# Patient Record
Sex: Male | Born: 2005 | Race: White | Hispanic: No | Marital: Single | Smoking: Never smoker
Health system: Southern US, Community
[De-identification: ages and names within clinical notes are randomized; demographics above are authoritative.]

## PROBLEM LIST (undated history)

## (undated) DIAGNOSIS — F845 Asperger's syndrome: Secondary | ICD-10-CM

## (undated) DIAGNOSIS — F909 Attention-deficit hyperactivity disorder, unspecified type: Secondary | ICD-10-CM

---
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
## 2023-06-19 ENCOUNTER — Ambulatory Visit: Payer: Self-pay | Admitting: Physician Assistant

## 2023-06-19 ENCOUNTER — Encounter: Payer: Self-pay | Admitting: Physician Assistant

## 2023-06-19 VITALS — BP 128/80 | HR 98 | Temp 98.3°F | Resp 16 | Ht 66.0 in | Wt 161.2 lb

## 2023-06-19 DIAGNOSIS — F845 Asperger's syndrome: Secondary | ICD-10-CM

## 2023-06-19 DIAGNOSIS — Z23 Encounter for immunization: Secondary | ICD-10-CM | POA: Diagnosis not present

## 2023-06-19 DIAGNOSIS — F909 Attention-deficit hyperactivity disorder, unspecified type: Secondary | ICD-10-CM | POA: Diagnosis not present

## 2023-06-19 MED ORDER — METHYLPHENIDATE HCL 10 MG PO TABS: 10.0000 mg | ORAL_TABLET | Freq: Every day | ORAL | 0 refills | Status: DC | PRN

## 2023-06-19 MED ORDER — METHYLPHENIDATE HCL ER (CD) 50 MG PO CPCR: 50.0000 mg | ORAL_CAPSULE | Freq: Every day | ORAL | 0 refills | Status: DC

## 2023-06-19 MED ORDER — GUANFACINE HCL ER 2 MG PO TB24: 2.0000 mg | ORAL_TABLET | Freq: Every day | ORAL | 1 refills | Status: DC

## 2023-06-19 NOTE — Progress Notes (Signed)
New patient visit   Patient: Zachary Nicholson   DOB: 2006/06/20   17 y.o. Male  MRN: [id number] Visit Date: 06/19/2023  Today's healthcare provider: Alfredia Ferguson, PA-C   Cc. New patient, adhd  Subjective    Zachary Nicholson is a 17 y.o. male who presents today as a new patient to establish care.  HPI  Discussed the use of AI scribe software for clinical note transcription with the patient, who gave verbal consent to proceed.  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 The patient has been on a stable regimen of ADHD medications, including Ritalin and Intuniv, for several years. The dosage was reduced when the patient was 17 years old, with no reported issues. The patient denies any current health issues and reports being in good health.       Past Medical History:  Diagnosis Date   ADHD (attention deficit hyperactivity disorder)    Asperger syndrome    Past Surgical History:  Procedure Laterality Date   TYMPANOSTOMY TUBE PLACEMENT  2006   No family status information on file.   History reviewed. No pertinent family history. Social History   Socioeconomic History   Marital status: Single    Spouse name: Not on file   Number of children: Not on file   Years of education: Not on file   Highest education level: Not on file  Occupational History   Not on file  Tobacco Use   Smoking status: Never   Smokeless tobacco: Never  Substance and Sexual Activity   Alcohol use: Never   Drug use: Not on file   Sexual activity: Not on file  Other Topics Concern   Not on file  Social History Narrative   Not on file   Social Determinants of Health   Financial Resource Strain: Not on file  Food Insecurity: Not on file  Transportation Needs: Not on file  Physical Activity: Not on file  Stress: Not on file  Social Connections: Not on file   Outpatient Medications Prior to Visit   Medication Sig   [DISCONTINUED] guanFACINE (INTUNIV) 2 MG TB24 SR tablet Take 2 mg by mouth at bedtime.   [DISCONTINUED] methylphenidate (METADATE CD) 50 MG CR capsule Take 50 mg by mouth daily.   [DISCONTINUED] methylphenidate (RITALIN) 10 MG tablet Take 10 mg by mouth daily as needed.   [DISCONTINUED] Melatonin 3 MG CAPS Take 3 mg by mouth at bedtime.   [DISCONTINUED] methylphenidate (METADATE CD) 20 MG CR capsule Take 20 mg by mouth every evening.     [DISCONTINUED] methylphenidate (METADATE CD) 60 MG CR capsule Take 60 mg by mouth every morning.     [DISCONTINUED] ondansetron (ZOFRAN) 4 MG tablet Take 0.5 tablets (2 mg total) by mouth every 8 (eight) hours as needed for nausea or vomiting.   No facility-administered medications prior to visit.   No Known Allergies  Immunization History  Administered Date(s) Administered   DTaP 05/30/2004, 07/31/2004, 09/25/2004, 06/29/2005, 05/10/2008   H1N1 09/14/2008   HIB (PRP-OMP) 05/30/2004, 07/31/2004, 06/29/2005   Hepatitis A, Ped/Adol-2 Dose 04/09/2005, 10/11/2005   Hepatitis B, PED/ADOLESCENT 2004/02/28, 04/26/2004, 01/03/2005   Hpv-Unspecified 12/16/2017, 07/09/2018   IPV 05/30/2004, 07/31/2004, 01/03/2005, 05/10/2008   Influenza, Seasonal, Injecte, Preservative Fre 06/19/2023   MMR 04/09/2005, 05/10/2008   MenQuadfi_Meningococcal Groups ACYW Conjugate 02/23/2021   Meningococcal B, OMV 02/23/2021, 03/27/2021   Meningococcal polysaccharide vaccine (MPSV4) 08/10/2015   PFIZER(Purple  Top)SARS-COV-2 Vaccination 05/05/2020, 05/26/2020   Pneumococcal Conjugate-13 05/30/2004, 07/31/2004, 09/25/2004, 04/09/2005   Tdap 08/10/2015   Varicella 04/09/2005, 05/10/2008    Health Maintenance  Topic Date Due   HIV Screening  Never done   Hepatitis C Screening  Never done   COVID-19 Vaccine (3 - 2023-24 season) 05/19/2023   DTaP/Tdap/Td (7 - Td or Tdap) 08/09/2025   INFLUENZA VACCINE  Completed   HPV VACCINES  Completed    Patient Care  Team: Alfredia Ferguson, PA-C as PCP - General (Physician Assistant)  Review of Systems  Constitutional:  Negative for fatigue and fever.  Respiratory:  Negative for cough and shortness of breath.   Cardiovascular:  Negative for chest pain, palpitations and leg swelling.  Neurological:  Negative for dizziness and headaches.     Objective    BP 128/80   Pulse 98   Temp 98.3 F (36.8 C) (Oral)   Resp 16   Ht 5\' 6"  (1.676 m)   Wt 161 lb 4 oz (73.1 kg)   SpO2 99%   BMI 26.03 kg/m   Physical Exam Constitutional:      General: He is awake.     Appearance: He is well-developed.  HENT:     Head: Normocephalic.  Eyes:     Conjunctiva/sclera: Conjunctivae normal.  Cardiovascular:     Rate and Rhythm: Normal rate and regular rhythm.     Heart sounds: Normal heart sounds.  Pulmonary:     Effort: Pulmonary effort is normal.     Breath sounds: Normal breath sounds.  Skin:    General: Skin is warm.  Neurological:     Mental Status: He is alert and oriented to person, place, and time.  Psychiatric:        Attention and Perception: Attention normal.        Mood and Affect: Mood normal.        Speech: Speech normal.        Behavior: Behavior is cooperative.     Depression Screen    06/19/2023    8:59 AM  PHQ 2/9 Scores  PHQ - 2 Score 0   No results found for any visits on 06/19/23.  Assessment & Plan      Problem List Items Addressed This Visit       Other   Attention deficit hyperactivity disorder (ADHD) - Primary    Stable on current medication regimen (Ritalin and Intuniv). No reported side effects or concerns. -Refill Ritalin 40 mg daily and 10 mg prn and Intuniv 2 mg prescriptions. -Recommend setting up with a psychiatrist for future medication management and changes. -Perform annual drug test and controlled substance contract.      Relevant Medications   guanFACINE (INTUNIV) 2 MG TB24 ER tablet   methylphenidate (METADATE CD) 50 MG CR capsule    methylphenidate (RITALIN) 10 MG tablet   Other Relevant Orders   Drug Monitoring Panel [phone] , Urine   Ambulatory referral to Psychiatry   Asperger disorder   Relevant Orders   Ambulatory referral to Psychiatry   Other Visit Diagnoses     Need for immunization against influenza       Relevant Orders   Flu vaccine trivalent PF, 6mos and older(Flulaval,Afluria,Fluarix,Fluzone) (Completed)       General Health Maintenance -Administer influenza vaccine today. -Request transfer of medical records from pediatrician to establish continuity of care. -Plan for physical examination based on last recorded date from pediatrician's records.       Return in  about 1 year (around 06/18/2024) for CPE.     I, Alfredia Ferguson, PA-C have reviewed all documentation for this visit. The documentation on  06/19/23   for the exam, diagnosis, procedures, and orders are all accurate and complete.    Alfredia Ferguson, PA-C  [facility] at [facility] [phone] (phone) [phone] (fax)  [facility]

## 2023-06-19 NOTE — Assessment & Plan Note (Addendum)
Stable on current medication regimen (Ritalin and Intuniv). No reported side effects or concerns. -Refill Ritalin 40 mg daily and 10 mg prn and Intuniv 2 mg prescriptions. -Recommend setting up with a psychiatrist for future medication management and changes. -Perform annual drug test and controlled substance contract.

## 2023-06-21 LAB — DRUG MONITORING PANEL 376104, URINE
Amphetamines: NEGATIVE ng/mL (ref <500)
Barbiturates: NEGATIVE ng/mL (ref <300)
Benzodiazepines: NEGATIVE ng/mL (ref <100)
Cocaine Metabolite: NEGATIVE ng/mL (ref <150)
Desmethyltramadol: NEGATIVE ng/mL (ref <100)
Opiates: NEGATIVE ng/mL (ref <100)
Oxycodone: NEGATIVE ng/mL (ref <100)
Tramadol: NEGATIVE ng/mL (ref <100)

## 2023-06-21 LAB — DM TEMPLATE

## 2023-07-01 ENCOUNTER — Ambulatory Visit: Payer: Self-pay | Admitting: Family Medicine

## 2023-07-22 ENCOUNTER — Other Ambulatory Visit: Payer: Self-pay | Admitting: Physician Assistant

## 2023-07-22 DIAGNOSIS — F909 Attention-deficit hyperactivity disorder, unspecified type: Secondary | ICD-10-CM

## 2023-07-22 MED ORDER — METHYLPHENIDATE HCL ER (CD) 50 MG PO CPCR: 50.0000 mg | ORAL_CAPSULE | Freq: Every day | ORAL | 0 refills | Status: DC

## 2023-08-07 ENCOUNTER — Ambulatory Visit: Payer: Self-pay | Admitting: Family Medicine

## 2023-08-27 ENCOUNTER — Other Ambulatory Visit: Payer: Self-pay | Admitting: Physician Assistant

## 2023-08-27 DIAGNOSIS — F909 Attention-deficit hyperactivity disorder, unspecified type: Secondary | ICD-10-CM

## 2023-08-27 MED ORDER — METHYLPHENIDATE HCL ER (CD) 50 MG PO CPCR: 50.0000 mg | ORAL_CAPSULE | Freq: Every day | ORAL | 0 refills | Status: DC

## 2023-08-27 NOTE — Telephone Encounter (Signed)
Requesting: methylphenidate 50mg   Contract: 06/19/23 UDS: 06/19/23 Last Visit: 06/19/23 Next Visit: None Last Refill: 07/22/23 #30 and 0RF   Please Advise

## 2023-09-28 ENCOUNTER — Other Ambulatory Visit: Payer: Self-pay | Admitting: Physician Assistant

## 2023-09-28 DIAGNOSIS — F909 Attention-deficit hyperactivity disorder, unspecified type: Secondary | ICD-10-CM

## 2023-09-30 MED ORDER — METHYLPHENIDATE HCL ER (CD) 50 MG PO CPCR: 50.0000 mg | ORAL_CAPSULE | Freq: Every day | ORAL | 0 refills | Status: DC

## 2023-09-30 NOTE — Telephone Encounter (Signed)
 Requesting: Metadate 50 mg Contract: 06/19/2023 UDS: 06/19/2023 Last Visit: 06/19/2023 Next Visit: N/A Last Refill: 08/27/2023   Please Advise

## 2023-10-31 ENCOUNTER — Other Ambulatory Visit: Payer: Self-pay | Admitting: Physician Assistant

## 2023-10-31 DIAGNOSIS — F909 Attention-deficit hyperactivity disorder, unspecified type: Secondary | ICD-10-CM

## 2023-10-31 MED ORDER — METHYLPHENIDATE HCL ER (CD) 50 MG PO CPCR: 50.0000 mg | ORAL_CAPSULE | Freq: Every day | ORAL | 0 refills | Status: DC

## 2023-10-31 MED ORDER — METHYLPHENIDATE HCL 10 MG PO TABS: 10.0000 mg | ORAL_TABLET | Freq: Every day | ORAL | 0 refills | Status: DC | PRN

## 2023-10-31 NOTE — Telephone Encounter (Signed)
Requesting: Metadate CD 50mg  and Ritalin 10MG   Contract: 06/19/23 UDS: 06/19/23 Last Visit: 06/19/23 Next Visit:  None Last Refill: see med list  Please Advise

## 2023-12-04 ENCOUNTER — Other Ambulatory Visit: Payer: Self-pay | Admitting: Physician Assistant

## 2023-12-04 DIAGNOSIS — F909 Attention-deficit hyperactivity disorder, unspecified type: Secondary | ICD-10-CM

## 2023-12-04 MED ORDER — METHYLPHENIDATE HCL ER (CD) 50 MG PO CPCR: 50.0000 mg | ORAL_CAPSULE | Freq: Every day | ORAL | 0 refills | Status: DC

## 2023-12-04 NOTE — Telephone Encounter (Signed)
 Requesting: Metadate 50 mg  Contract: 06/19/2023 UDS: 06/19/2023 Last Visit: 06/19/2023 Next Visit: N/A Last Refill: 10/31/2023  Please Advise

## 2024-01-01 ENCOUNTER — Other Ambulatory Visit: Payer: Self-pay | Admitting: Physician Assistant

## 2024-01-01 DIAGNOSIS — F909 Attention-deficit hyperactivity disorder, unspecified type: Secondary | ICD-10-CM

## 2024-01-02 MED ORDER — METHYLPHENIDATE HCL ER (CD) 50 MG PO CPCR: 50.0000 mg | ORAL_CAPSULE | Freq: Every day | ORAL | 0 refills | Status: DC

## 2024-01-02 NOTE — Telephone Encounter (Signed)
 Requesting: Metadate CD 50mg   Contract:  06/19/23 UDS: 06/19/23 Last Visit: 06/19/23  Next Visit: None Last Refill: 12/04/23 #30 and 0RF   Please Advise

## 2024-02-03 ENCOUNTER — Other Ambulatory Visit: Payer: Self-pay | Admitting: Physician Assistant

## 2024-02-03 DIAGNOSIS — F909 Attention-deficit hyperactivity disorder, unspecified type: Secondary | ICD-10-CM

## 2024-02-03 NOTE — Telephone Encounter (Signed)
 Copied from CRM [id number]. Topic: Clinical - Medication Refill >> Feb 03, 2024  8:44 AM Alysia Jumbo S wrote: Medication: methylphenidate  (METADATE  CD) 50 MG CR capsule  Has the patient contacted their pharmacy? No (Agent: If no, request that the patient contact the pharmacy for the refill. If patient does not wish to contact the pharmacy document the reason why and proceed with request.) (Agent: If yes, when and what did the pharmacy advise?)  This is the patient's preferred pharmacy:  [id number] - [place], [place] - [address] [place] [address] Phone: [phone] Fax: [phone]  Is this the correct pharmacy for this prescription? Yes If no, delete pharmacy and type the correct one.   Has the prescription been filled recently? No  Is the patient out of the medication? Yes  Has the patient been seen for an appointment in the last year OR does the patient have an upcoming appointment? Yes  Can we respond through MyChart? No  Agent: Please be advised that Rx refills may take up to 3 business days. We ask that you follow-up with your pharmacy.

## 2024-02-03 NOTE — Telephone Encounter (Signed)
 Requesting: Metadate  CD 50mg   Contract:  06/19/23 UDS: 06/19/23 Last Visit: 06/19/23  Next Visit: None Last Refill: 01/02/24 #30 and 0RF    Please Advise

## 2024-02-05 ENCOUNTER — Other Ambulatory Visit: Payer: Self-pay | Admitting: Physician Assistant

## 2024-02-05 DIAGNOSIS — F909 Attention-deficit hyperactivity disorder, unspecified type: Secondary | ICD-10-CM

## 2024-02-06 ENCOUNTER — Encounter: Payer: Self-pay | Admitting: Physician Assistant

## 2024-02-06 ENCOUNTER — Ambulatory Visit: Admitting: Physician Assistant

## 2024-02-06 DIAGNOSIS — F909 Attention-deficit hyperactivity disorder, unspecified type: Secondary | ICD-10-CM

## 2024-02-06 MED ORDER — METHYLPHENIDATE HCL ER (CD) 50 MG PO CPCR: 50.0000 mg | ORAL_CAPSULE | Freq: Every day | ORAL | 0 refills | Status: DC

## 2024-02-06 MED ORDER — GUANFACINE HCL ER 2 MG PO TB24: 2.0000 mg | ORAL_TABLET | Freq: Every day | ORAL | 1 refills | Status: DC

## 2024-02-06 MED ORDER — METHYLPHENIDATE HCL 10 MG PO TABS: 10.0000 mg | ORAL_TABLET | Freq: Every day | ORAL | 0 refills | Status: DC | PRN

## 2024-02-06 NOTE — Progress Notes (Signed)
      Established patient visit   Patient: Zachary Nicholson   DOB: [birth year]   18 y.o. Male  MRN: [id number] Visit Date: 02/06/2024  Today's healthcare provider: Trenton Frock, PA-C   Cc. Adhd fu  Subjective    Discussed the use of AI scribe software for clinical note transcription with the patient, who gave verbal consent to proceed.  History of Present Illness   Zachary Nicholson is a 18 year old male who presents for a routine ADHD f/u.  He experiences no changes in his medications, which are effective at current doses.       Medications: Outpatient Medications Prior to Visit  Medication Sig   [DISCONTINUED] guanFACINE  (INTUNIV ) 2 MG TB24 ER tablet Take 1 tablet (2 mg total) by mouth at bedtime.   [DISCONTINUED] methylphenidate  (METADATE  CD) 50 MG CR capsule Take 1 capsule (50 mg total) by mouth daily.   [DISCONTINUED] methylphenidate  (RITALIN ) 10 MG tablet Take 1 tablet (10 mg total) by mouth daily as needed.   No facility-administered medications prior to visit.    Review of Systems  Constitutional:  Negative for fatigue and fever.  Respiratory:  Negative for cough and shortness of breath.   Cardiovascular:  Negative for chest pain, palpitations and leg swelling.  Neurological:  Negative for dizziness and headaches.       Objective    BP 127/71   Pulse 92   Ht 5\' 9"  (1.753 m)   Wt 185 lb 3.2 oz (84 kg)   BMI 27.35 kg/m    Physical Exam Constitutional:      General: He is awake.     Appearance: He is well-developed.  HENT:     Head: Normocephalic.  Eyes:     Conjunctiva/sclera: Conjunctivae normal.  Cardiovascular:     Rate and Rhythm: Normal rate and regular rhythm.     Heart sounds: Normal heart sounds.  Pulmonary:     Effort: Pulmonary effort is normal.     Breath sounds: Normal breath sounds.  Skin:    General: Skin is warm.  Neurological:     Mental Status: He is alert and oriented to person, place, and time.  Psychiatric:         Attention and Perception: Attention normal.        Mood and Affect: Mood normal.        Speech: Speech normal.        Behavior: Behavior is cooperative.     No results found for any visits on 02/06/24.  Assessment & Plan    Attention deficit hyperactivity disorder (ADHD), unspecified ADHD type -     Methylphenidate  HCl ER (CD); Take 1 capsule (50 mg total) by mouth daily.  Dispense: 30 capsule; Refill: 0 -     Methylphenidate  HCl; Take 1 tablet (10 mg total) by mouth daily as needed.  Dispense: 30 tablet; Refill: 0 -     guanFACINE  HCl ER; Take 1 tablet (2 mg total) by mouth at bedtime.  Dispense: 90 tablet; Refill: 1   Stable, refilled. Recommending making CPE, fasting for labs.  Return in about 4 weeks (around 03/05/2024) for CPE.       Trenton Frock, PA-C  [facility] [phone] (phone) [phone] (fax)  [facility]

## 2024-02-20 ENCOUNTER — Ambulatory Visit (INDEPENDENT_AMBULATORY_CARE_PROVIDER_SITE_OTHER): Admitting: Physician Assistant

## 2024-02-20 ENCOUNTER — Encounter: Payer: Self-pay | Admitting: Physician Assistant

## 2024-02-20 VITALS — BP 127/77 | HR 99 | Ht 69.0 in | Wt 186.6 lb

## 2024-02-20 DIAGNOSIS — Z0000 Encounter for general adult medical examination without abnormal findings: Secondary | ICD-10-CM

## 2024-02-20 LAB — CBC WITH DIFFERENTIAL/PLATELET
Basophils Absolute: 0.1 10*3/uL (ref 0.0–0.1)
Basophils Relative: 0.8 % (ref 0.0–3.0)
Eosinophils Absolute: 0.1 10*3/uL (ref 0.0–0.7)
Eosinophils Relative: 1.1 % (ref 0.0–5.0)
HCT: 50 % — ABNORMAL HIGH (ref 36.0–49.0)
Hemoglobin: 16.9 g/dL — ABNORMAL HIGH (ref 12.0–16.0)
Lymphocytes Relative: 34.8 % (ref 24.0–48.0)
Lymphs Abs: 2.9 10*3/uL (ref 0.7–4.0)
MCHC: 33.8 g/dL (ref 31.0–37.0)
MCV: 83.7 fl (ref 78.0–98.0)
Monocytes Absolute: 0.9 10*3/uL (ref 0.1–1.0)
Monocytes Relative: 10.9 % (ref 3.0–12.0)
Neutro Abs: 4.4 10*3/uL (ref 1.4–7.7)
Neutrophils Relative %: 52.4 % (ref 43.0–71.0)
Platelets: 263 10*3/uL (ref 150.0–575.0)
RBC: 5.97 Mil/uL — ABNORMAL HIGH (ref 3.80–5.70)
RDW: 13.3 % (ref 11.4–15.5)
WBC: 8.4 10*3/uL (ref 4.5–13.5)

## 2024-02-20 LAB — LIPID PANEL
Cholesterol: 171 mg/dL (ref 0–200)
HDL: 33.3 mg/dL — ABNORMAL LOW (ref >39.00)
LDL Cholesterol: 105 mg/dL — ABNORMAL HIGH (ref 0–99)
NonHDL: 137.57
Total CHOL/HDL Ratio: 5
Triglycerides: 164 mg/dL — ABNORMAL HIGH (ref 0.0–149.0)
VLDL: 32.8 mg/dL (ref 0.0–40.0)

## 2024-02-20 LAB — COMPREHENSIVE METABOLIC PANEL WITH GFR
ALT: 31 U/L (ref 0–53)
AST: 20 U/L (ref 0–37)
Albumin: 5.1 g/dL (ref 3.5–5.2)
Alkaline Phosphatase: 78 U/L (ref 52–171)
BUN: 12 mg/dL (ref 6–23)
CO2: 24 meq/L (ref 19–32)
Calcium: 10.3 mg/dL (ref 8.4–10.5)
Chloride: 104 meq/L (ref 96–112)
Creatinine, Ser: 0.95 mg/dL (ref 0.40–1.50)
GFR: 115.7 mL/min (ref >60.00)
Glucose, Bld: 99 mg/dL (ref 70–99)
Potassium: 4 meq/L (ref 3.5–5.1)
Sodium: 141 meq/L (ref 135–145)
Total Bilirubin: 0.6 mg/dL (ref 0.2–1.2)
Total Protein: 7.9 g/dL (ref 6.0–8.3)

## 2024-02-20 NOTE — Progress Notes (Signed)
 Complete physical exam   Patient: Zachary Nicholson   DOB: 14-Dec-2005   18 y.o. Male  MRN: [id number] Visit Date: 02/20/2024  Today's healthcare provider: Trenton Frock, PA-C   Cc. cpe  Subjective    Zachary Nicholson is a 18 y.o. male who presents today for a complete physical exam.   No acute concerns today. Declines STI testing, never sexually active.  Past Medical History:  Diagnosis Date   ADHD (attention deficit hyperactivity disorder)    Asperger syndrome    Past Surgical History:  Procedure Laterality Date   TYMPANOSTOMY TUBE PLACEMENT  2006   Social History   Socioeconomic History   Marital status: Single    Spouse name: Not on file   Number of children: Not on file   Years of education: Not on file   Highest education level: Not on file  Occupational History   Not on file  Tobacco Use   Smoking status: Never   Smokeless tobacco: Never  Substance and Sexual Activity   Alcohol use: Never   Drug use: Not on file   Sexual activity: Not on file  Other Topics Concern   Not on file  Social History Narrative   Not on file   Social Drivers of Health   Financial Resource Strain: Not on file  Food Insecurity: Not on file  Transportation Needs: Not on file  Physical Activity: Not on file  Stress: Not on file  Social Connections: Not on file  Intimate Partner Violence: Not on file   No family status information on file.   History reviewed. No pertinent family history. No Known Allergies  Patient Care Team: Trenton Frock, PA-C as PCP - General (Physician Assistant)   Medications: Outpatient Medications Prior to Visit  Medication Sig   guanFACINE  (INTUNIV ) 2 MG TB24 ER tablet Take 1 tablet (2 mg total) by mouth at bedtime.   methylphenidate  (METADATE  CD) 50 MG CR capsule Take 1 capsule (50 mg total) by mouth daily.   methylphenidate  (RITALIN ) 10 MG tablet Take 1 tablet (10 mg total) by mouth daily as needed.   No facility-administered medications  prior to visit.    Review of Systems  Constitutional:  Negative for fatigue and fever.  Respiratory:  Negative for cough and shortness of breath.   Cardiovascular:  Negative for chest pain, palpitations and leg swelling.  Genitourinary:  Positive for frequency.  Neurological:  Negative for dizziness and headaches.      Objective    BP 127/77   Pulse 99   Ht 5\' 9"  (1.753 m)   Wt 186 lb 9.6 oz (84.6 kg)   BMI 27.56 kg/m    Physical Exam Constitutional:      General: He is awake.     Appearance: He is well-developed.  HENT:     Head: Normocephalic.     Right Ear: Tympanic membrane, ear canal and external ear normal.     Left Ear: Tympanic membrane, ear canal and external ear normal.     Nose: Nose normal. No congestion or rhinorrhea.     Mouth/Throat:     Mouth: Mucous membranes are moist.     Pharynx: No oropharyngeal exudate or posterior oropharyngeal erythema.  Eyes:     Pupils: Pupils are equal, round, and reactive to light.  Cardiovascular:     Rate and Rhythm: Normal rate and regular rhythm.     Heart sounds: Normal heart sounds.  Pulmonary:     Effort: Pulmonary effort is  normal.     Breath sounds: Normal breath sounds.  Abdominal:     General: There is no distension.     Palpations: Abdomen is soft.     Tenderness: There is no abdominal tenderness. There is no guarding.  Musculoskeletal:     Cervical back: Normal range of motion.     Right lower leg: No edema.     Left lower leg: No edema.  Lymphadenopathy:     Cervical: No cervical adenopathy.  Skin:    General: Skin is warm.  Neurological:     Mental Status: He is alert and oriented to person, place, and time.  Psychiatric:        Attention and Perception: Attention normal.        Mood and Affect: Mood normal.        Speech: Speech normal.        Behavior: Behavior normal. Behavior is cooperative.     Last depression screening scores    02/20/2024    8:11 AM 06/19/2023    8:59 AM  PHQ 2/9 Scores   PHQ - 2 Score 0 0   Last fall risk screening    02/20/2024    8:11 AM  Fall Risk   Falls in the past year? 0  Number falls in past yr: 0  Injury with Fall? 0  Risk for fall due to : No Fall Risks  Follow up Falls evaluation completed   Last Audit-C alcohol use screening     No data to display         A score of 3 or more in women, and 4 or more in men indicates increased risk for alcohol abuse, EXCEPT if all of the points are from question 1   No results found for any visits on 02/20/24.  Assessment & Plan    Routine Health Maintenance and Physical Exam  Exercise Activities and Dietary recommendations   --balanced diet high in fiber and protein, low in sugars, carbs, fats. --physical activity/exercise 20-30 minutes 3-5 times a week    Immunization History  Administered Date(s) Administered   DTaP 05/30/2004, 07/31/2004, 09/25/2004, 06/29/2005, 05/10/2008   H1N1 09/14/2008   HIB (PRP-OMP) 05/30/2004, 07/31/2004, 06/29/2005   Hepatitis A, Ped/Adol-2 Dose 04/09/2005, 10/11/2005   Hepatitis B, PED/ADOLESCENT 09-06-2004, 04/26/2004, 01/03/2005   Hpv-Unspecified 12/16/2017, 07/09/2018   IPV 05/30/2004, 07/31/2004, 01/03/2005, 05/10/2008   Influenza, Seasonal, Injecte, Preservative Fre 06/19/2023   MMR 04/09/2005, 05/10/2008   MenQuadfi_Meningococcal Groups ACYW Conjugate 02/23/2021   Meningococcal B, OMV 02/23/2021, 03/27/2021   Meningococcal polysaccharide vaccine (MPSV4) 08/10/2015   PFIZER(Purple Top)SARS-COV-2 Vaccination 05/05/2020, 05/26/2020   Pneumococcal Conjugate-13 05/30/2004, 07/31/2004, 09/25/2004, 04/09/2005   Tdap 08/10/2015   Varicella 04/09/2005, 05/10/2008    Health Maintenance  Topic Date Due   HIV Screening  Never done   Hepatitis C Screening  Never done   COVID-19 Vaccine (3 - 2024-25 season) 05/19/2023   INFLUENZA VACCINE  04/17/2024   DTaP/Tdap/Td (7 - Td or Tdap) 08/09/2025   Pneumococcal Vaccine 8-44 Years old  Completed   HPV VACCINES   Completed   Meningococcal B Vaccine  Completed    Discussed health benefits of physical activity, and encouraged him to engage in regular exercise appropriate for his age and condition.  Problem List Items Addressed This Visit   None Visit Diagnoses       Annual physical exam    -  Primary   Relevant Orders   CBC w/Diff   Comp Met (CMET)  Lipid panel       Return in about 6 months (around 08/21/2024) for adhd.     Trenton Frock, PA-C  [facility] at [facility] [phone] (phone) [phone] (fax)  [facility]

## 2024-02-21 ENCOUNTER — Ambulatory Visit: Payer: Self-pay | Admitting: Physician Assistant

## 2024-02-21 DIAGNOSIS — D582 Other hemoglobinopathies: Secondary | ICD-10-CM

## 2024-03-09 ENCOUNTER — Encounter: Payer: Self-pay | Admitting: Physician Assistant

## 2024-03-09 ENCOUNTER — Other Ambulatory Visit (INDEPENDENT_AMBULATORY_CARE_PROVIDER_SITE_OTHER)

## 2024-03-09 ENCOUNTER — Other Ambulatory Visit: Payer: Self-pay | Admitting: Physician Assistant

## 2024-03-09 DIAGNOSIS — F909 Attention-deficit hyperactivity disorder, unspecified type: Secondary | ICD-10-CM

## 2024-03-09 DIAGNOSIS — D582 Other hemoglobinopathies: Secondary | ICD-10-CM | POA: Diagnosis not present

## 2024-03-09 MED ORDER — METHYLPHENIDATE HCL ER (CD) 50 MG PO CPCR: 50.0000 mg | ORAL_CAPSULE | Freq: Every day | ORAL | 0 refills | Status: DC

## 2024-03-09 MED ORDER — METHYLPHENIDATE HCL 10 MG PO TABS: 10.0000 mg | ORAL_TABLET | Freq: Every day | ORAL | 0 refills | Status: DC | PRN

## 2024-03-09 NOTE — Telephone Encounter (Signed)
 This encounter was created in error - please disregard.

## 2024-03-09 NOTE — Telephone Encounter (Signed)
 Copied from CRM [id number]. Topic: Clinical - Medication Refill >> Mar 09, 2024  3:46 PM Suzen RAMAN wrote: Medication: methylphenidate  (METADATE  CD) 50 MG CR capsule  Has the patient contacted their pharmacy? Yes  This is the patient's preferred pharmacy:  [id number] - [place], [place] - [address] [place] [address] Phone: [phone] Fax: [phone]  Is this the correct pharmacy for this prescription? Yes If no, delete pharmacy and type the correct one.   Has the prescription been filled recently? No  Is the patient out of the medication? Yes  Has the patient been seen for an appointment in the last year OR does the patient have an upcoming appointment? Yes  Can we respond through MyChart? Yes  Agent: Please be advised that Rx refills may take up to 3 business days. We ask that you follow-up with your pharmacy.

## 2024-03-09 NOTE — Addendum Note (Signed)
 Addended by: TRUDY CURVIN RAMAN on: 03/09/2024 10:19 AM   Modules accepted: Orders

## 2024-03-09 NOTE — Telephone Encounter (Signed)
Request has already been sent to PCP.  °

## 2024-03-10 NOTE — Addendum Note (Signed)
 Addended by: TRUDY CURVIN RAMAN on: 03/10/2024 08:02 AM   Modules accepted: Orders

## 2024-03-13 ENCOUNTER — Other Ambulatory Visit (INDEPENDENT_AMBULATORY_CARE_PROVIDER_SITE_OTHER)

## 2024-03-13 DIAGNOSIS — D582 Other hemoglobinopathies: Secondary | ICD-10-CM

## 2024-03-13 LAB — CBC WITH DIFFERENTIAL/PLATELET
Basophils Absolute: 0 10*3/uL (ref 0.0–0.1)
Basophils Relative: 0.7 % (ref 0.0–3.0)
Eosinophils Absolute: 0 10*3/uL (ref 0.0–0.7)
Eosinophils Relative: 0.8 % (ref 0.0–5.0)
HCT: 47.5 % (ref 36.0–49.0)
Hemoglobin: 16 g/dL (ref 12.0–16.0)
Lymphocytes Relative: 33.3 % (ref 24.0–48.0)
Lymphs Abs: 1.8 10*3/uL (ref 0.7–4.0)
MCHC: 33.7 g/dL (ref 31.0–37.0)
MCV: 83.8 fl (ref 78.0–98.0)
Monocytes Absolute: 0.6 10*3/uL (ref 0.1–1.0)
Monocytes Relative: 10.7 % (ref 3.0–12.0)
Neutro Abs: 3 10*3/uL (ref 1.4–7.7)
Neutrophils Relative %: 54.5 % (ref 43.0–71.0)
Platelets: 259 10*3/uL (ref 150.0–575.0)
RBC: 5.67 Mil/uL (ref 3.80–5.70)
RDW: 13.1 % (ref 11.4–15.5)
WBC: 5.5 10*3/uL (ref 4.5–13.5)

## 2024-03-13 NOTE — Addendum Note (Signed)
 Addended by: TRUDY CURVIN RAMAN on: 03/13/2024 07:36 AM   Modules accepted: Orders

## 2024-03-14 LAB — IRON,TIBC AND FERRITIN PANEL
%SAT: 29 % (ref 16–48)
Ferritin: 193 ng/mL (ref 38–380)
Iron: 114 ug/dL (ref 27–164)
TIBC: 398 ug/dL (ref 271–448)

## 2024-03-16 ENCOUNTER — Ambulatory Visit: Payer: Self-pay | Admitting: Physician Assistant

## 2024-04-07 ENCOUNTER — Other Ambulatory Visit: Payer: Self-pay | Admitting: Physician Assistant

## 2024-04-07 DIAGNOSIS — F909 Attention-deficit hyperactivity disorder, unspecified type: Secondary | ICD-10-CM

## 2024-04-07 MED ORDER — METHYLPHENIDATE HCL ER (CD) 50 MG PO CPCR: 50.0000 mg | ORAL_CAPSULE | Freq: Every day | ORAL | 0 refills | Status: DC

## 2024-04-07 NOTE — Telephone Encounter (Signed)
 Copied from CRM [id number]. Topic: Clinical - Medication Refill >> Apr 07, 2024  9:19 AM Aleatha C wrote: Medication: methylphenidate  (METADATE  CD) 50 MG CR capsule  Has the patient contacted their pharmacy? No (Agent: If no, request that the patient contact the pharmacy for the refill. If patient does not wish to contact the pharmacy document the reason why and proceed with request.) (Agent: If yes, when and what did the pharmacy advise?)  This is the patient's preferred pharmacy:  [id number] - [place], [place] - [address] [place] [address] Phone: [phone] Fax: [phone]  Is this the correct pharmacy for this prescription? Yes If no, delete pharmacy and type the correct one.   Has the prescription been filled recently? No  Is the patient out of the medication? No  Has the patient been seen for an appointment in the last year OR does the patient have an upcoming appointment? Yes  Can we respond through MyChart? No  Agent: Please be advised that Rx refills may take up to 3 business days. We ask that you follow-up with your pharmacy.

## 2024-04-07 NOTE — Telephone Encounter (Signed)
 Requesting: methylphenidate  50mg   Contract: 06/19/23 UDS: 06/19/23 Last Visit: 02/20/24 Next Visit: None Last Refill: 03/09/24 #30 and 0RF  Please Advise

## 2024-05-11 ENCOUNTER — Other Ambulatory Visit: Payer: Self-pay | Admitting: Physician Assistant

## 2024-05-11 DIAGNOSIS — F909 Attention-deficit hyperactivity disorder, unspecified type: Secondary | ICD-10-CM

## 2024-05-11 NOTE — Telephone Encounter (Unsigned)
 Copied from CRM #[id number]. Topic: Clinical - Medication Refill >> May 11, 2024  9:47 AM Frederich PARAS wrote: Medication: methylphenidate  (METADATE  CD) 50 MG CR capsule  Has the patient contacted their pharmacy? No He says when he contact the pharmacy they tell him to contact us .  This is the patient's preferred pharmacy:  [id number] - [place], [place] - [address] [place] [address] Phone: [phone] Fax: [phone]  Is this the correct pharmacy for this prescription? Yes   Has the prescription been filled recently? No  Is the patient out of the medication? Yes  Has the patient been seen for an appointment in the last year OR does the patient have an upcoming appointment? Yes  Can we respond through MyChart? No  Agent: Please be advised that Rx refills may take up to 3 business days. We ask that you follow-up with your pharmacy.

## 2024-05-12 MED ORDER — METHYLPHENIDATE HCL ER (CD) 50 MG PO CPCR: 50.0000 mg | ORAL_CAPSULE | Freq: Every day | ORAL | 0 refills | Status: DC

## 2024-05-12 NOTE — Telephone Encounter (Signed)
 Requesting: methylphenidate  50mg  Contract: Yes UDS: 06/19/23 Last Visit: 02/20/2024 Next Visit: Visit date not found Last Refill: 04/07/24  Please Advise

## 2024-06-15 ENCOUNTER — Other Ambulatory Visit: Payer: Self-pay | Admitting: Physician Assistant

## 2024-06-15 DIAGNOSIS — F909 Attention-deficit hyperactivity disorder, unspecified type: Secondary | ICD-10-CM

## 2024-06-15 MED ORDER — METHYLPHENIDATE HCL ER (CD) 50 MG PO CPCR
50.0000 mg | ORAL_CAPSULE | Freq: Every day | ORAL | 0 refills | Status: DC
Start: 2024-06-15 — End: 2024-07-06

## 2024-06-15 NOTE — Telephone Encounter (Signed)
 Requesting: methylphenidate  Contract:06/19/23 UDS:06/19/23 Last Visit:02/20/24 Next Visit: None Last Refill: 05/12/24 #30 and 0RF   Please Advise

## 2024-06-15 NOTE — Telephone Encounter (Unsigned)
 Copied from CRM #[id number]. Topic: Clinical - Medication Refill >> Jun 15, 2024  8:40 AM Precious C wrote: Medication: methylphenidate  (METADATE  CD) 50 MG CR capsule  Has the patient contacted their pharmacy? No, pt stated pharmacy always have pt call in (Agent: If no, request that the patient contact the pharmacy for the refill. If patient does not wish to contact the pharmacy document the reason why and proceed with request.) (Agent: If yes, when and what did the pharmacy advise?)  This is the patient's preferred pharmacy:  [id number] - [place], [place] - [address] [place] [address] Phone: [phone] Fax: [phone]  Is this the correct pharmacy for this prescription? Yes If no, delete pharmacy and type the correct one.   Has the prescription been filled recently? No  Is the patient out of the medication? Yes, has 1 pill left  Has the patient been seen for an appointment in the last year OR does the patient have an upcoming appointment? Yes  Can we respond through MyChart? Yes  Agent: Please be advised that Rx refills may take up to 3 business days. We ask that you follow-up with your pharmacy.

## 2024-06-26 ENCOUNTER — Ambulatory Visit: Payer: Self-pay

## 2024-06-26 NOTE — Telephone Encounter (Signed)
 Copied from CRM [id number]. Topic: Clinical - Prescription Issue >> Jun 26, 2024  2:37 PM Jasmin G wrote: Reason for CRM: Pt states that his methylphenidate  (METADATE  CD) 50 MG CR capsule has been in back order at his preferred pharmacy, [id number] - [place], [place] - [address], for a week now and his pharmacy suggested to him to ask for a 60 MG instead due to stating that 50 MG is usually hard to get. Call pt back if needed at [phone].

## 2024-07-02 ENCOUNTER — Telehealth: Payer: Self-pay | Admitting: Neurology

## 2024-07-02 NOTE — Telephone Encounter (Signed)
 Copied from CRM #[id number]. Topic: Clinical - Medication Question >> Jul 02, 2024  1:26 PM Tysheama G wrote: Reason for CRM: Dr.Drubel is retired and patient is expected to be seeing Dr.Beck now but the appointment isnt til 10/05/2024 and he needs his medication methylphenidate  (METADATE  CD) 60 MG (he wants to see if he can up the dosage from 50 to 60 since it has a back order at the pharmacy) and methylphenidate  (RITALIN ) 10MG . He's been without methylphenidate  (METADATE  CD) for a month now and running low on the other medication. Can Dr.Beck fill his medication before the appointment. Callback number [phone]

## 2024-07-02 NOTE — Telephone Encounter (Signed)
 FYI   Copied from CRM [id number]. Topic: Appointments - Transfer of Care >> Jul 02, 2024  1:19 PM Tysheama G wrote: Pt is requesting to transfer FROM: same location, different provider Pt is requesting to transfer TO: same location Reason for requested transfer: Dr.Brubel has retired. It is the responsibility of the team the patient would like to transfer to (Dr. Waddell Mon) to reach out to the patient if for any reason this transfer is not acceptable.

## 2024-07-06 ENCOUNTER — Telehealth: Payer: Self-pay | Admitting: Physician Assistant

## 2024-07-06 DIAGNOSIS — F909 Attention-deficit hyperactivity disorder, unspecified type: Secondary | ICD-10-CM

## 2024-07-06 MED ORDER — METHYLPHENIDATE HCL 10 MG PO TABS: 10.0000 mg | ORAL_TABLET | Freq: Every day | ORAL | 0 refills | Status: DC | PRN

## 2024-07-06 MED ORDER — METHYLPHENIDATE HCL ER (CD) 50 MG PO CPCR: 50.0000 mg | ORAL_CAPSULE | Freq: Every day | ORAL | 0 refills | Status: DC

## 2024-07-06 MED ORDER — GUANFACINE HCL ER 2 MG PO TB24: 2.0000 mg | ORAL_TABLET | Freq: Every day | ORAL | 0 refills | Status: DC

## 2024-07-06 NOTE — Telephone Encounter (Signed)
 Needs appt for controlled substance refills. They can make it a regular office visit if that will get him in sooner.

## 2024-07-06 NOTE — Telephone Encounter (Signed)
Got him scheduled

## 2024-07-06 NOTE — Telephone Encounter (Signed)
 Tried to call patient back to let him know medications sent, no answer, vm has a different name.

## 2024-07-06 NOTE — Telephone Encounter (Signed)
 Yes, one month supply okay. Keep appointment.

## 2024-07-06 NOTE — Telephone Encounter (Signed)
 Pt has an appt for 07/13/24 for a med refill and wants to know if it would be possible to get meds filled to last until their appt as he has completely ran out. Wants to be called back if we are able to refill them.

## 2024-07-06 NOTE — Telephone Encounter (Signed)
 See note. Appt scheduled sooner. Pended RX for a one month supply, sign if appropriate.

## 2024-07-06 NOTE — Telephone Encounter (Signed)
 This was sent to [facility], but she sent it back to us . Has an appt in January to establish care. Please advise.

## 2024-07-13 ENCOUNTER — Encounter: Payer: Self-pay | Admitting: Family Medicine

## 2024-07-13 ENCOUNTER — Telehealth: Payer: Self-pay | Admitting: Neurology

## 2024-07-13 ENCOUNTER — Ambulatory Visit: Admitting: Family Medicine

## 2024-07-13 VITALS — BP 104/64 | HR 94 | Ht 69.0 in | Wt 195.0 lb

## 2024-07-13 DIAGNOSIS — Z0000 Encounter for general adult medical examination without abnormal findings: Secondary | ICD-10-CM

## 2024-07-13 DIAGNOSIS — F909 Attention-deficit hyperactivity disorder, unspecified type: Secondary | ICD-10-CM | POA: Diagnosis not present

## 2024-07-13 MED ORDER — METHYLPHENIDATE HCL ER (CD) 60 MG PO CPCR: 60.0000 mg | ORAL_CAPSULE | Freq: Every day | ORAL | 0 refills | Status: DC

## 2024-07-13 NOTE — Telephone Encounter (Signed)
 Changed to 60 mg dose. Please call pharmacy to cancel 50 mg prescription.

## 2024-07-13 NOTE — Telephone Encounter (Signed)
 Spoke with patient's mother, they do not have 50 mg available, but they do have the 60 mg. Please send.

## 2024-07-13 NOTE — Assessment & Plan Note (Signed)
 UDS/Contract today Refills sent in last week - states he is having trouble getting the methylphenidate  50 mg at times. He is going to call his pharmacy to see if they received the order and have it in stock. If not, he will check to see if they get the 60 mg dose more readily and we can changes doses if needed.

## 2024-07-13 NOTE — Telephone Encounter (Signed)
 LVM letting pharmacy know to cancel 50 mg dosage.

## 2024-07-13 NOTE — Telephone Encounter (Signed)
 Copied from CRM [id number]. Topic: Clinical - Prescription Issue >> Jul 13, 2024  9:44 AM Alexandria E wrote: Reason for CRM: Patient called in questioning if he can change the dosage of his methylphenidate  (METADATE  CD) 50 MG CR capsule to 60 MG instead, as the pharmacy does not have the 50 MG.

## 2024-07-13 NOTE — Progress Notes (Signed)
   Established Patient Office Visit  Subjective   Patient ID: Zachary Nicholson, male    DOB: 15-Dec-2005  Age: 18 y.o. MRN: [id number]  Chief Complaint  Patient presents with   Establish Care    HPI   Patient is here to establish care. Reports he lives with his family and is starting to look for a job. He has been treated for ADHD since childhood.    ADHD/ADD: - Medications: guanfacine  ER 2 mg nightly, methylphenidate  CR 50 mg daily, methylphenidate  10 mg PRN - Satisfied with current therapy: yes, sometimes has supply/pharmacy issues with extended release - Controlled substance contract: today - UDS: today - Previous psychiatry evaluation: childhood - Previous medications:  - ADHD Medication Side Effects: no    Decreased appetite: no    Headache: no    Sleeping disturbance pattern: no    Irritability: no    Anxiousness: no    Dizziness: no         ROS All review of systems negative except what is listed in the HPI    Objective:     BP 104/64   Pulse 94   Ht 5' 9 (1.753 m)   Wt 195 lb (88.5 kg)   SpO2 98%   BMI 28.80 kg/m    Physical Exam Vitals reviewed.  Constitutional:      General: He is not in acute distress.    Appearance: Normal appearance. He is not ill-appearing.  Cardiovascular:     Rate and Rhythm: Normal rate and regular rhythm.  Pulmonary:     Effort: Pulmonary effort is normal.     Breath sounds: Normal breath sounds.  Skin:    General: Skin is warm and dry.  Neurological:     Mental Status: He is alert and oriented to person, place, and time.  Psychiatric:        Mood and Affect: Mood normal.        Behavior: Behavior normal.        Thought Content: Thought content normal.        Judgment: Judgment normal.         No results found for any visits on 07/13/24.    The ASCVD Risk score (Arnett DK, et al., 2019) failed to calculate for the following reasons:   The 2019 ASCVD risk score is only valid for ages 39 to 50     Assessment & Plan:   Problem List Items Addressed This Visit       Active Problems   Attention deficit hyperactivity disorder (ADHD) - Primary   UDS/Contract today Refills sent in last week - states he is having trouble getting the methylphenidate  50 mg at times. He is going to call his pharmacy to see if they received the order and have it in stock. If not, he will check to see if they get the 60 mg dose more readily and we can changes doses if needed.       Relevant Orders   Drug Monitoring Panel [id number] , Urine   Other Visit Diagnoses       Encounter for medical examination to establish care            Return in about 3 months (around 10/13/2024) for chronic disease management - stop at lab for UDS today .    Waddell KATHEE Mon, NP

## 2024-08-17 ENCOUNTER — Telehealth: Payer: Self-pay | Admitting: Physician Assistant

## 2024-08-17 DIAGNOSIS — F909 Attention-deficit hyperactivity disorder, unspecified type: Secondary | ICD-10-CM

## 2024-08-17 NOTE — Telephone Encounter (Unsigned)
 Copied from CRM #[id number]. Topic: Clinical - Medication Refill >> Aug 17, 2024 12:32 PM Frederich PARAS wrote: Medication: methylphenidate  (METADATE  CD) 60 MG CR capsule methylphenidate  (RITALIN ) 10 MG tablet  Has the patient contacted their pharmacy? no She says they are always told to contact us    This is the patient's preferred pharmacy:  [id number] - [place], [place] - [address] [place] [address] Phone: [phone] Fax: [phone]  Is this the correct pharmacy for this prescription? Yes If no, delete pharmacy and type the correct one.   Has the prescription been filled recently? Yes  Is the patient out of the medication? Yes  Has the patient been seen for an appointment in the last year OR does the patient have an upcoming appointment? Yes  Can we respond through MyChart? Yes  Agent: Please be advised that Rx refills may take up to 3 business days. We ask that you follow-up with your pharmacy.

## 2024-08-18 MED ORDER — METHYLPHENIDATE HCL ER (CD) 60 MG PO CPCR: 60.0000 mg | ORAL_CAPSULE | Freq: Every day | ORAL | 0 refills | Status: DC

## 2024-08-18 MED ORDER — METHYLPHENIDATE HCL 10 MG PO TABS: 10.0000 mg | ORAL_TABLET | Freq: Every day | ORAL | 0 refills | Status: AC | PRN

## 2024-08-18 NOTE — Telephone Encounter (Signed)
 Requesting: Methylphenidate  60 mg CR and Methylphenidate  10 mg  Contract: 07/13/2024 UDS: 2024 - ordered 07/13/2024 but not run Last Visit: 07/13/2024 Next Visit: 10/06/2023 Last Refill: Methylphenidate  60 - 07/13/2024 #30 no refills  Methylphenidate  10 - 07/06/2024 #30 no refills   Please Advise

## 2024-08-18 NOTE — Telephone Encounter (Signed)
 Requesting: methylphenidate  10mg  and 60mg   Contract:06/19/23 UDS: 06/19/23 Last Visit: 07/13/24 Next Visit:10/05/24 Last Refill: see med list   Please Advise

## 2024-09-01 ENCOUNTER — Other Ambulatory Visit: Payer: Self-pay | Admitting: Family Medicine

## 2024-09-01 DIAGNOSIS — F909 Attention-deficit hyperactivity disorder, unspecified type: Secondary | ICD-10-CM

## 2024-09-18 ENCOUNTER — Other Ambulatory Visit: Payer: Self-pay | Admitting: Family Medicine

## 2024-09-18 DIAGNOSIS — F909 Attention-deficit hyperactivity disorder, unspecified type: Secondary | ICD-10-CM

## 2024-09-18 NOTE — Telephone Encounter (Signed)
 Requesting: Methylphenidate  60 mg CR  UDS: 2024 - ordered 07/13/2024 but not run Last Visit: 07/13/2024 Next Visit: 10/06/2023 Last Refill: 08/18/2024 #30 no refills   Please Advise

## 2024-09-18 NOTE — Telephone Encounter (Signed)
 Copied from CRM #[id number]. Topic: Clinical - Medication Refill >> Sep 18, 2024  9:40 AM Tinnie C wrote: Medication: methylphenidate  (METADATE  CD) 60 MG CR capsule  Has the patient contacted their pharmacy? No (Agent: If no, request that the patient contact the pharmacy for the refill. If patient does not wish to contact the pharmacy document the reason why and proceed with request.) (Agent: If yes, when and what did the pharmacy advise?)  This is the patient's preferred pharmacy:  [id number] - [place], [place] - [address] [place] [address] Phone: [phone] Fax: [phone]  Is this the correct pharmacy for this prescription? Yes If no, delete pharmacy and type the correct one.   Has the prescription been filled recently? Yes  Is the patient out of the medication? 2 left  Has the patient been seen for an appointment in the last year OR does the patient have an upcoming appointment? Yes  Can we respond through MyChart? Yes  Agent: Please be advised that Rx refills may take up to 3 business days. We ask that you follow-up with your pharmacy.

## 2024-09-21 MED ORDER — METHYLPHENIDATE HCL ER (CD) 60 MG PO CPCR: 60.0000 mg | ORAL_CAPSULE | Freq: Every day | ORAL | 0 refills | Status: DC

## 2024-10-02 NOTE — Progress Notes (Incomplete)
" °  °  New Patient Office Visit   Subjective     Patient ID: Zachary Nicholson, male   DOB: 02-Jul-2006  Age: 19 y.o. MRN: [id number]   CC:  No chief complaint on file.     HPI Zachary Nicholson presents to establish care       Show/hide medication list[1] Past Medical History:  Diagnosis Date   ADHD (attention deficit hyperactivity disorder)    Asperger syndrome     Past Surgical History:  Procedure Laterality Date   TYMPANOSTOMY TUBE PLACEMENT  2006     No family history on file.  Social History   Socioeconomic History   Marital status: Single    Spouse name: Not on file   Number of children: Not on file   Years of education: Not on file   Highest education level: Not on file  Occupational History   Not on file  Tobacco Use   Smoking status: Never   Smokeless tobacco: Never  Substance and Sexual Activity   Alcohol use: Never   Drug use: Not on file   Sexual activity: Not on file  Other Topics Concern   Not on file  Social History Narrative   Not on file   Social Drivers of Health   Tobacco Use: Low Risk (07/13/2024)   Patient History    Smoking Tobacco Use: Never    Smokeless Tobacco Use: Never    Passive Exposure: Not on file  Financial Resource Strain: Not on file  Food Insecurity: Not on file  Transportation Needs: Not on file  Physical Activity: Not on file  Stress: Not on file  Social Connections: Not on file  Depression (PHQ2-9): Low Risk (07/13/2024)   Depression (PHQ2-9)    PHQ-2 Score: 1  Recent Concern: Depression (PHQ2-9) - Medium Risk (07/13/2024)   Depression (PHQ2-9)    PHQ-2 Score: 5  Alcohol Screen: Not on file  Housing: Not on file  Utilities: Not on file  Health Literacy: Not on file       ROS All review of systems negative except what is listed in the HPI    Objective     There were no vitals taken for this visit.  Physical Exam     Assessment & Plan:     Problem List Items Addressed This Visit   None             No follow-ups on file.  Zachary KATHEE Mon, NP  I,Zachary Nicholson,acting as a scribe for Zachary KATHEE Mon, NP.,have documented all relevant documentation on the behalf of Zachary KATHEE Mon, NP.  I, Zachary KATHEE Mon, NP, have reviewed all documentation for this visit. The documentation on 10/05/2024 for the exam, diagnosis, procedures, and orders are all accurate and complete.    [1]  Outpatient Medications Prior to Visit  Medication Sig   guanFACINE  (INTUNIV ) 2 MG TB24 ER tablet TAKE 1 TABLET BY MOUTH AT BEDTIME.   methylphenidate  (METADATE  CD) 60 MG CR capsule Take 1 capsule (60 mg total) by mouth daily.   methylphenidate  (RITALIN ) 10 MG tablet Take 1 tablet (10 mg total) by mouth daily as needed.   No facility-administered medications prior to visit.   "

## 2024-10-05 ENCOUNTER — Encounter: Admitting: Family Medicine

## 2024-10-13 ENCOUNTER — Ambulatory Visit: Admitting: Family Medicine

## 2024-10-23 ENCOUNTER — Telehealth: Payer: Self-pay | Admitting: Neurology

## 2024-10-23 DIAGNOSIS — F909 Attention-deficit hyperactivity disorder, unspecified type: Secondary | ICD-10-CM

## 2024-10-23 MED ORDER — METHYLPHENIDATE HCL ER (CD) 60 MG PO CPCR: 60.0000 mg | ORAL_CAPSULE | Freq: Every day | ORAL | 0 refills | Status: AC

## 2024-10-23 NOTE — Telephone Encounter (Signed)
 Short supply provided. No refills after this until he comes in and gets UDS.

## 2024-10-23 NOTE — Telephone Encounter (Signed)
 Requesting: Methylphenidate  60 mg CR  UDS: 2024 - ordered 07/13/2024 but not run Last Visit: 07/13/2024 Next Visit: 10/30/2023 (scheduled 10/05/2024 - patient cancelled, 10/13/2024 - cancelled due to weather) Last Refill: 09/22/2023 #30 no refills   Please Advise

## 2024-10-23 NOTE — Telephone Encounter (Signed)
 Copied from CRM #[id number]. Topic: Clinical - Medication Refill >> Oct 23, 2024 12:31 PM Chasity T wrote: Medication: methylphenidate  (METADATE  CD) 60 MG CR capsule   Has the patient contacted their pharmacy? Yes   This is the patient's preferred pharmacy:  [id number] - [place], [place] - [address] [place] [address] Phone: [phone] Fax: [phone]  Is this the correct pharmacy for this prescription? Yes If no, delete pharmacy and type the correct one.   Has the prescription been filled recently? Yes  Is the patient out of the medication? Yes  Has the patient been seen for an appointment in the last year OR does the patient have an upcoming appointment? Yes  Can we respond through MyChart? Yes  Agent: Please be advised that Rx refills may take up to 3 business days. We ask that you follow-up with your pharmacy.

## 2024-10-29 ENCOUNTER — Ambulatory Visit: Admitting: Family Medicine
